# Patient Record
Sex: Male | Born: 1935 | Race: White | Hispanic: No | Marital: Single | Smoking: Former smoker
Health system: Southern US, Community
[De-identification: ages and names within clinical notes are randomized; demographics above are authoritative.]

## PROBLEM LIST (undated history)

## (undated) DIAGNOSIS — J439 Emphysema, unspecified: Secondary | ICD-10-CM

## (undated) DIAGNOSIS — R079 Chest pain, unspecified: Secondary | ICD-10-CM

## (undated) DIAGNOSIS — I10 Essential (primary) hypertension: Secondary | ICD-10-CM

## (undated) DIAGNOSIS — G4730 Sleep apnea, unspecified: Secondary | ICD-10-CM

## (undated) HISTORY — DX: Essential (primary) hypertension: I10

## (undated) HISTORY — DX: Sleep apnea, unspecified: G47.30

## (undated) HISTORY — DX: Chest pain, unspecified: R07.9

## (undated) HISTORY — DX: Emphysema, unspecified: J43.9

---

## 2003-04-17 ENCOUNTER — Encounter: Admission: RE | Admit: 2003-04-17 | Discharge: 2003-04-17 | Payer: Self-pay | Admitting: Orthopaedic Surgery

## 2004-06-02 ENCOUNTER — Ambulatory Visit (HOSPITAL_BASED_OUTPATIENT_CLINIC_OR_DEPARTMENT_OTHER): Admission: RE | Admit: 2004-06-02 | Discharge: 2004-06-02 | Payer: Self-pay | Admitting: Urology

## 2004-06-06 ENCOUNTER — Inpatient Hospital Stay (HOSPITAL_COMMUNITY): Admission: EM | Admit: 2004-06-06 | Discharge: 2004-06-07 | Payer: Self-pay | Admitting: Pulmonary Disease

## 2004-06-06 ENCOUNTER — Ambulatory Visit: Payer: Self-pay | Admitting: Pulmonary Disease

## 2004-06-06 ENCOUNTER — Ambulatory Visit (HOSPITAL_COMMUNITY): Admission: RE | Admit: 2004-06-06 | Discharge: 2004-06-06 | Payer: Self-pay | Admitting: Urology

## 2004-06-14 ENCOUNTER — Ambulatory Visit: Payer: Self-pay | Admitting: Pulmonary Disease

## 2004-06-20 ENCOUNTER — Ambulatory Visit (HOSPITAL_COMMUNITY): Admission: RE | Admit: 2004-06-20 | Discharge: 2004-06-20 | Payer: Self-pay | Admitting: Urology

## 2004-06-28 ENCOUNTER — Ambulatory Visit: Payer: Self-pay | Admitting: Pulmonary Disease

## 2004-09-29 ENCOUNTER — Ambulatory Visit: Payer: Self-pay | Admitting: Pulmonary Disease

## 2005-04-14 ENCOUNTER — Ambulatory Visit: Payer: Self-pay | Admitting: Pulmonary Disease

## 2006-07-30 ENCOUNTER — Ambulatory Visit: Payer: Self-pay | Admitting: Pulmonary Disease

## 2006-09-10 ENCOUNTER — Ambulatory Visit: Payer: Self-pay | Admitting: Pulmonary Disease

## 2007-05-01 DIAGNOSIS — J449 Chronic obstructive pulmonary disease, unspecified: Secondary | ICD-10-CM

## 2007-05-01 DIAGNOSIS — N200 Calculus of kidney: Secondary | ICD-10-CM

## 2007-05-01 DIAGNOSIS — N401 Enlarged prostate with lower urinary tract symptoms: Secondary | ICD-10-CM

## 2007-05-01 DIAGNOSIS — I10 Essential (primary) hypertension: Secondary | ICD-10-CM | POA: Insufficient documentation

## 2011-11-22 ENCOUNTER — Other Ambulatory Visit (HOSPITAL_COMMUNITY): Payer: Self-pay | Admitting: Urology

## 2011-11-22 DIAGNOSIS — D497 Neoplasm of unspecified behavior of endocrine glands and other parts of nervous system: Secondary | ICD-10-CM

## 2011-11-30 ENCOUNTER — Other Ambulatory Visit (HOSPITAL_COMMUNITY): Payer: Self-pay

## 2011-12-04 ENCOUNTER — Other Ambulatory Visit (HOSPITAL_COMMUNITY): Payer: Self-pay | Admitting: Urology

## 2011-12-04 ENCOUNTER — Ambulatory Visit (HOSPITAL_COMMUNITY)
Admission: RE | Admit: 2011-12-04 | Discharge: 2011-12-04 | Disposition: A | Discharge status: Home / Self Care | Payer: Medicare Other | Source: Ambulatory Visit | Attending: Urology | Admitting: Urology

## 2011-12-04 DIAGNOSIS — N289 Disorder of kidney and ureter, unspecified: Secondary | ICD-10-CM | POA: Insufficient documentation

## 2011-12-04 DIAGNOSIS — K5730 Diverticulosis of large intestine without perforation or abscess without bleeding: Secondary | ICD-10-CM | POA: Insufficient documentation

## 2011-12-04 DIAGNOSIS — Z950 Presence of cardiac pacemaker: Secondary | ICD-10-CM | POA: Insufficient documentation

## 2011-12-04 DIAGNOSIS — D497 Neoplasm of unspecified behavior of endocrine glands and other parts of nervous system: Secondary | ICD-10-CM

## 2011-12-04 DIAGNOSIS — E279 Disorder of adrenal gland, unspecified: Secondary | ICD-10-CM | POA: Insufficient documentation

## 2011-12-04 LAB — CREATININE, SERUM
Creatinine, Ser: 1.14 mg/dL (ref 0.50–1.35)
GFR calc Af Amer: 68 mL/min — ABNORMAL LOW (ref >90)
GFR calc non Af Amer: 58 mL/min — ABNORMAL LOW (ref >90)

## 2011-12-04 LAB — BUN: BUN: 19 mg/dL (ref 6–23)

## 2014-03-22 DEATH — deceased

## 2014-06-30 ENCOUNTER — Institutional Professional Consult (permissible substitution): Payer: Medicare Other | Admitting: Critical Care Medicine

## 2014-12-30 ENCOUNTER — Ambulatory Visit (INDEPENDENT_AMBULATORY_CARE_PROVIDER_SITE_OTHER): Payer: Medicare Other | Admitting: Pulmonary Disease

## 2014-12-30 ENCOUNTER — Encounter: Payer: Self-pay | Admitting: Pulmonary Disease

## 2014-12-30 ENCOUNTER — Ambulatory Visit (INDEPENDENT_AMBULATORY_CARE_PROVIDER_SITE_OTHER)
Admission: RE | Admit: 2014-12-30 | Discharge: 2014-12-30 | Disposition: A | Discharge status: Home / Self Care | Payer: Medicare Other | Source: Ambulatory Visit | Attending: Pulmonary Disease | Admitting: Pulmonary Disease

## 2014-12-30 VITALS — BP 150/80 | HR 72 | Temp 97.0°F | Ht 66.0 in | Wt 187.6 lb

## 2014-12-30 DIAGNOSIS — Z8701 Personal history of pneumonia (recurrent): Secondary | ICD-10-CM

## 2014-12-30 DIAGNOSIS — J449 Chronic obstructive pulmonary disease, unspecified: Secondary | ICD-10-CM

## 2014-12-30 DIAGNOSIS — J9610 Chronic respiratory failure, unspecified whether with hypoxia or hypercapnia: Secondary | ICD-10-CM | POA: Insufficient documentation

## 2014-12-30 DIAGNOSIS — J9611 Chronic respiratory failure with hypoxia: Secondary | ICD-10-CM | POA: Diagnosis not present

## 2014-12-30 DIAGNOSIS — J8410 Pulmonary fibrosis, unspecified: Secondary | ICD-10-CM | POA: Diagnosis not present

## 2014-12-30 MED ORDER — PREDNISONE 10 MG PO TABS: ORAL_TABLET | ORAL | Status: AC

## 2014-12-30 NOTE — Patient Instructions (Signed)
Travarus- it was sure nice meeting you today...    I hope that we can help with your breathing...  Today we checked a breathing test, oxygen test & a follow up CXR...    We will contact you w/ the results when available...   For now I want you to continue taking your NEBULIZER w/ Duoneb three times daily (morn, mid-day, evening)  Take the SYMBICORT 2 puffs via AEROCHAMBER twic daily (after the [place] Rx)...  Take the Spiriva once daily after the mid-day Neb treatment)...  We are going to add a trial of an anti-inflammatory medication-- PREDNISONE 10mg  tabs- one tab daily each AM    And we will plan a recheck on this med in 6 weeks...  Call for any questions...  Let's plan a follow up visit here in 6 weeks time.Marland KitchenMarland Kitchen

## 2015-01-05 ENCOUNTER — Encounter: Payer: Self-pay | Admitting: Pulmonary Disease

## 2015-01-05 NOTE — Progress Notes (Signed)
Subjective:     Patient ID: Marco Willis, male   DOB: 1929/05/11, 81 y.o.   MRN: [id number]  HPI 81 y/o WM referred by DrRedding in [facility] for a pulmonary evaluation due to COPD>       [name] has been cared for by DrRedding & DrChodri for many years> hx COPD, hx of pneumonia, r/o OSA (pt refused sleep test), RLS;  He was hospitalized at [facility] 7/20 - 12/11/14 w/ acute on chronic resp failure, COPD exac, and pneumonia> he had cough, sputum, increased dyspnea;  CXR suggested bibasilar atx vs pneumonia, CBC was normal, he was treated w/ Rocephin & Zithromax, plus Oxygen, NEBS and Solumedrol for the COPD; he improved and was disch home;  He had f/u w/ DrRedding & requested a pulmonary second opinion but is not happy about having to come to [facility] for this appt- I assured him that we would transfer his care to our pulm team in [place] once that facility is up & running...       The pt says he's had breathing problems for the last 5-6 yrs> he is an ex-smoker having started at age 10 but only smoked until about 81 y/o (up to 1ppd), and quit 45 yrs ago;  His CC is SOB w/ mild actvities like walking, housework, etc (esp if rushing) but notes that he still does ok if he takes his time; symptoms have been worse in the last 19mo, esp worse in the hot humid weather;  He denies much cough, sput, no blood, denies CP or f/c/s/ etc;  He says that he "keeps pneumonia" siting the recent hodspitalization for "double pneumonia"; he estimates that he's had pneumonia maybe 6 times- starting at age 64 in the service;  He is pretty sure that he's had both pneumonia shots... We do not have records from DrChodri, pt's current meds include> O2 at 2L/min, Symbicort80-2spBid, Spiriva daily, NEBS w/ Duoneb bid at home (he does it 2-3 times daily)...   EXAM showed Afeb, VSS, O2sat=94% on RA;  Heent- neg, mallampati2;  Chest- decr BS at bases w/ few rhonchi, no wheezing/ rales/ or signs of consolidation;  Heart- RR% gr1/6 SEM w/o  r/g;  Abd- soft, neg;  Ext- VI w/o c/c/e...   CT Angio Chest 08/25/11 (in PACS)> neg for PE; fibrosis and atelectasis at bases bilat, no adenopathy, adrenal adenoma, DJD Tspine...   CXR 12/30/14 showed mild cardiomeg & pacer, low lung volumes w/ mild bibasilar atx, NAD...   Spirometry 12/30/14 showed fair cooperation w/ the testing> FVC=1.27 (35%), FEV1=0.87 (32%), %1sec=68, mid-flows were reduced at 20% predicted...  Ambulatory oxygen saturation test 12/30/14> showed O2sat=93% on RA at rest w/ pulse=71/min;  He was only able to walk one lap in the office & stopped w/ dyspnea & fatigue; lowest O2sat=88% w/ pulse=89/min...   IMP/PLAN>>  [name] is 82 and has signif cardiac & pulmonary issues> old data from DrChodri would be helpful in sorting out his historic impairment & any progression; in the meanwhile we discussed consolidating his current bronchodil regimen by using the NEB Tid (approx breakfast/ lunch/ dinner) followed by the Symbicort (via aerochamber) Bid at brkfst & dinner, and the Spiriva at lunch;  In addition we decided on a trial of oral Pred- 10mg  Qam for the next month or so w/ ROV recheck in 4-6weeks; finally I stressed to him the importance of a regular exercise program...    Past Medical History  Diagnosis Date  . Hypertension >> on ToprolXL50 & Demadex20   .  Chest pain    Cardiac pacemaker   . COPD & basilar fibrosis   . 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   Outpatient Encounter Prescriptions as of 12/30/2014  Medication Sig  . ALPRAZolam (XANAX) 0.25 MG tablet Take 0.25 mg by mouth at bedtime.  Marland Kitchen aspirin 81 MG tablet Take 81 mg by mouth daily.  . budesonide-formoterol (SYMBICORT) 80-4.5 MCG/ACT inhaler Inhale 2 puffs into the lungs 2 (two) times daily.  Marland Kitchen escitalopram  (LEXAPRO) 10 MG tablet Take 10 mg by mouth daily.  . finasteride (PROSCAR) 5 MG tablet 5 mg daily.  Marland Kitchen ipratropium-albuterol (DUONEB) 0.5-2.5 (3) MG/3ML SOLN Take 3 mLs by nebulization 2 (two) times daily.  . metoprolol succinate (TOPROL-XL) 50 MG 24 hr tablet 50 mg daily.  Marland Kitchen rOPINIRole (REQUIP) 0.25 MG tablet Take 0.25 mg by mouth at bedtime.  . sennosides-docusate sodium (SENOKOT-S) 8.6-50 MG tablet Take 1 tablet by mouth daily.  Marland Kitchen SPIRIVA HANDIHALER 18 MCG inhalation capsule 1 capsule daily.  Marland Kitchen torsemide (DEMADEX) 20 MG tablet 20 mg daily.  . traMADol (ULTRAM) 50 MG tablet 1 tablet 2 (two) times daily.  . traZODone (DESYREL) 100 MG tablet 100 mg at bedtime.   No facility-administered encounter medications on file as of 12/30/2014.    Allergies  Allergen Reactions  . Penicillins     REACTION: rash    No family history on file.   Social History   Social History  . Marital Status: Single    Spouse Name: N/A  . Number of Children: N/A  . Years of Education: N/A   Occupational History  . Not on file.   Social History Main Topics  . Smoking status: Former Smoker -- 1.00 packs/day for 22 years    Types: Cigarettes    Quit date: 12/29/1973  . Smokeless tobacco: Not on file  . Alcohol Use: Not on file  . Drug Use: Not on file  . Sexual Activity: Not on file   Other Topics Concern  . Not on file   Social History Narrative    Current Medications, Allergies, Past Medical History, Past Surgical History, Family History, and Social History were reviewed in [employer] record.   Review of Systems            All symptoms NEG except where BOLDED >>  Constitutional:  F/C/S, fatigue, anorexia, unexpected weight change. HEENT:  HA, visual changes, hearing loss, earache, nasal symptoms, sore throat, mouth sores, hoarseness. Resp:  cough, sputum, hemoptysis; SOB, tightness, wheezing. Cardio:  CP, palpit, DOE, orthopnea, edema. GI:  N/V/D/C, blood in stool;  reflux, abd pain, distention, gas. GU:  dysuria, freq, urgency, hematuria, flank pain, voiding difficulty. MS:  joint pain, swelling, tenderness, decr ROM; neck pain, back pain, etc. Neuro:  HA, tremors, seizures, dizziness, syncope, weakness, numbness, gait abn. Skin:  suspicious lesions or skin rash. Heme:  adenopathy, bruising, bleeding. Psyche:  confusion, agitation, sleep disturbance, hallucinations, anxiety, depression suicidal.   Objective:   Physical Exam      Vital Signs:  Reviewed...  General:  WD, WN, 81 y/o WM in NAD; alert & oriented; pleasant & cooperative... HEENT:  [place]/AT; Conjunctiva- pink, Sclera- nonicteric, EOM-wnl, PERRLA, EACs-clear, TMs-wnl; NOSE-clear; THROAT-clear &  wnl. Neck:  Stiff w/ fair ROM; no JVD; normal carotid impulses w/o bruits; no thyromegaly or nodules palpated; no lymphadenopathy. Chest:  Diaphragms are high, decr BS at bases, few scat rhonchi, no wheezing or rales or consolidation... Heart:  Regular Rhythm; gr 1/6 SEM w/o rubs or gallops detected. Abdomen:  Soft & nontender- no guarding or rebound; normal bowel sounds; no organomegaly or masses palpated. Ext:  decrROM; without deformities +arthritic changes; no varicose veins, +venous insuffic, tr edema;  Pulses intact w/o bruits. Neuro:  No focal neuro deficits; sensory testing normal; gait is abn & balance OK. Derm:  No lesions noted; no rash etc. Lymph:  No cervical, supraclavicular, axillary, or inguinal adenopathy palpated.   Assessment:      IMP >>    COPD- mixed type> continue NEBS w/ duoneb Tid, Symbicort80-2spBid, Spiriva daily; we added PRED10mg Qam...    Suspect element of pulm fibrosis w/ old CT Chest showing fibrosis & atx at the bases bilat...     Chronic hypoxemic resp failure> continue Home O2    RLS> on Requip    HBP> on MetoprololER & Demadex    LBBB & Cardiac pacemaker> followed by Cards in [place]    Anxiety/ Depression> on Xanax, Desyrel, Lexapro   PLAN >>  River is 74  and has signif cardiac & pulmonary issues> old data from DrChodri would be helpful in sorting out his historic impairment & any progression; in the meanwhile we discussed consolidating his current bronchodil regimen by using the NEB Tid (approx breakfast/ lunch/ dinner) followed by the Symbicort (via aerochamber) Bid at brkfst & dinner, and the Spiriva at lunch;  In addition we decided on a trial of oral Pred- 10mg  Qam for the next month or so w/ ROV recheck in 4-6weeks; finally I stressed to him the importance of a regular exercise program     Plan:     Patient's Medications  New Prescriptions   PREDNISONE (DELTASONE) 10 MG TABLET    Take as directed  Previous Medications   ALPRAZOLAM (XANAX) 0.25 MG TABLET    Take 0.25 mg by mouth at bedtime.   ASPIRIN 81 MG TABLET    Take 81 mg by mouth daily.   BUDESONIDE-FORMOTEROL (SYMBICORT) 80-4.5 MCG/ACT INHALER    Inhale 2 puffs into the lungs 2 (two) times daily.   ESCITALOPRAM (LEXAPRO) 10 MG TABLET    Take 10 mg by mouth daily.   FINASTERIDE (PROSCAR) 5 MG TABLET    5 mg daily.   IPRATROPIUM-ALBUTEROL (DUONEB) 0.5-2.5 (3) MG/3ML SOLN    Take 3 mLs by nebulization 2 (two) times daily.   METOPROLOL SUCCINATE (TOPROL-XL) 50 MG 24 HR TABLET    50 mg daily.   ROPINIROLE (REQUIP) 0.25 MG TABLET    Take 0.25 mg by mouth at bedtime.   SENNOSIDES-DOCUSATE SODIUM (SENOKOT-S) 8.6-50 MG TABLET    Take 1 tablet by mouth daily.   SPIRIVA HANDIHALER 18 MCG INHALATION CAPSULE    1 capsule daily.   TORSEMIDE (DEMADEX) 20 MG TABLET    20 mg daily.   TRAMADOL (ULTRAM) 50 MG TABLET    1 tablet 2 (two) times daily.   TRAZODONE (DESYREL) 100 MG TABLET    100 mg at bedtime.  Modified Medications   No medications on file  Discontinued Medications   No medications on file

## 2015-01-19 ENCOUNTER — Telehealth: Payer: Self-pay | Admitting: Pulmonary Disease

## 2015-01-19 NOTE — Telephone Encounter (Signed)
Called [facility] and reached the answering service. WCB in AM

## 2015-01-20 NOTE — Telephone Encounter (Signed)
Called [facility] and spoke to [place]. Jeani Hawking stated there is no record of this pt nor any issues needing resolution. Jeani Hawking stated she would speak with Maudie Mercury when she returns to the office and will call us back.

## 2015-01-21 NOTE — Telephone Encounter (Signed)
Kim from [facility] return call can be reached @ [phone].Hillery Hunter

## 2015-01-21 NOTE — Telephone Encounter (Signed)
Spoke with Maudie Mercury at [facility], states she does not know what tests needed to be done according to their notes.  After reviewing pt's one visit with SN it looks like 02 was never ordered for pt.  Maudie Mercury will look into this and call us back.

## 2015-01-21 NOTE — Telephone Encounter (Signed)
Called Kim with [facility] and she reports she looked and he is not a patient with them. Per Maudie Mercury, they didn't call our office. Will sign off message

## 2015-02-10 ENCOUNTER — Encounter: Payer: Self-pay | Admitting: Pulmonary Disease

## 2015-02-10 ENCOUNTER — Ambulatory Visit (INDEPENDENT_AMBULATORY_CARE_PROVIDER_SITE_OTHER): Payer: Medicare Other | Admitting: Pulmonary Disease

## 2015-02-10 VITALS — BP 118/60 | HR 80 | Temp 97.4°F | Wt 193.0 lb

## 2015-02-10 DIAGNOSIS — J8410 Pulmonary fibrosis, unspecified: Secondary | ICD-10-CM

## 2015-02-10 DIAGNOSIS — J9611 Chronic respiratory failure with hypoxia: Secondary | ICD-10-CM | POA: Diagnosis not present

## 2015-02-10 DIAGNOSIS — Z8701 Personal history of pneumonia (recurrent): Secondary | ICD-10-CM | POA: Diagnosis not present

## 2015-02-10 DIAGNOSIS — Z23 Encounter for immunization: Secondary | ICD-10-CM | POA: Diagnosis not present

## 2015-02-10 DIAGNOSIS — J449 Chronic obstructive pulmonary disease, unspecified: Secondary | ICD-10-CM

## 2015-02-10 NOTE — Patient Instructions (Signed)
Today we updated your med list in our EPIC system...    Continue your current medications the same...  Continue the breathing treatments w/ the NEBULIZER three times daily, followed by the Symbicort & Spiriva as directed...  We decided to wean off the Prednisone>    Decrease your current dose (10mg  tabs) to one tab every other day until they are gone, then stop the Prednisone for now...  Call for any questions or if I can be of service in any way...  We will arrange for a follow up visit in about 3 months at the [facility] clinic.Marland KitchenMarland Kitchen

## 2015-02-10 NOTE — Progress Notes (Signed)
Subjective:     Patient ID: Marco Willis, male   DOB: 12-17-1928, 81 y.o.   MRN: [id number]  HPI ~  December 30, 2014:  Initial pulmonary consult w/ SN>   86 y/o WM referred by DrRedding in [facility] for a pulmonary evaluation due to COPD>       [name] has been cared for by DrRedding & DrChodri for many years> hx COPD, hx of pneumonia, r/o OSA (pt refused sleep test), RLS;  He was hospitalized at [facility] 7/20 - 12/11/14 w/ acute on chronic resp failure, COPD exac, and pneumonia> he had cough, sputum, increased dyspnea;  CXR suggested bibasilar atx vs pneumonia, CBC was normal, he was treated w/ Rocephin & Zithromax, plus Oxygen, NEBS and Solumedrol for the COPD; he improved and was disch home;  He had f/u w/ DrRedding & requested a pulmonary second opinion but is not happy about having to come to [facility] for this appt- I assured him that we would transfer his care to our pulm team in [place] once that facility is up & running...       The pt says he's had breathing problems for the last 5-6 yrs> he is an ex-smoker having started at age 38 but only smoked until about 81 y/o (up to 1ppd), and quit 45 yrs ago;  His CC is SOB w/ mild actvities like walking, housework, etc (esp if rushing) but notes that he still does ok if he takes his time; symptoms have been worse in the last 24mo, esp worse in the hot humid weather;  He denies much cough, sput, no blood, denies CP or f/c/s/ etc;  He says that he "keeps pneumonia" siting the recent hospitalization for "double pneumonia"; he estimates that he's had pneumonia maybe 6 times- starting at age 44 in the service;  He is pretty sure that he's had both pneumonia shots... We do not have records from DrChodri, pt's current meds include> O2 at 2L/min, Symbicort80-2spBid, Spiriva daily, NEBS w/ Duoneb bid at home (he does it 2-3 times daily)...       EXAM showed Afeb, VSS, O2sat=94% on RA;  Heent- neg, mallampati2;  Chest- decr BS at bases w/ few rhonchi, no wheezing/  rales/ or signs of consolidation;  Heart- RR% gr1/6 SEM w/o r/g;  Abd- soft, neg;  Ext- VI w/o c/c/e...   CT Angio Chest 08/25/11 (in PACS)> neg for PE; fibrosis and atelectasis at bases bilat, no adenopathy, adrenal adenoma, DJD Tspine...   CXR 12/30/14 showed mild cardiomeg & pacer, low lung volumes w/ mild bibasilar atx, NAD...   Spirometry 12/30/14 showed fair cooperation w/ the testing> FVC=1.27 (35%), FEV1=0.87 (32%), %1sec=68, mid-flows were reduced at 20% predicted...c/w combined mild obstructive & mod restrictive dis  Ambulatory oxygen saturation test 12/30/14> showed O2sat=93% on RA at rest w/ pulse=71/min;  He was only able to walk one lap in the office & stopped w/ dyspnea & fatigue; lowest O2sat=88% w/ pulse=89/min...   IMP/PLAN>>  Marco Willis is 44 and has signif cardiac & pulmonary issues> old data from DrChodri would be helpful in sorting out his historic impairment & any progression; in the meanwhile we discussed consolidating his current bronchodil regimen by using the NEB Tid (approx breakfast/ lunch/ dinner) followed by the Symbicort (via aerochamber) Bid at brkfst & dinner, and the Spiriva at lunch;  In addition we decided on a trial of oral Pred- 10mg  Qam for the next month or so w/ ROV recheck in 4-6weeks; finally I stressed to him  the importance of a regular exercise program...   ~  February 10, 2015:  6wk ROV w/ SN>  His PCP is DrBurkhart in [place]- they will check w/ him to be sure he's had both pneumonia vaccines...      [name] has been using the NEBULIZER w/ Duoneb tid followed by his Symbicort80-2spBid & Spiriva once daily, and the Pred10mg /d; he has home O2 at 2L/min; he notes breathing is "about the same" but then notes that he only notes the SOB when carrying 50 lb bags of feed!  Overall "doing pretty good", getting PT & doing some exercise (gardening, walking); he notes min cough, sput-clear, SOB w/o change and no CP, swelling, etc; his CC is that his balance is poor & I noted  to him that the PT should address that issue; we never received records from DrChodri...  Pt's son indicates to me that he is doing much better by his estimate- getting about better, resting better, & family keeping up w/ his meds...       EXAM showed Afeb, VSS, O2sat=93% on 2L/min;  Heent- neg, mallampati2;  Chest- decr BS at bases, clear- no wheezing/ rales/ or signs of consolidation;  Heart- RR, gr1/6 SEM w/o r/g;  Abd- soft, neg;  Ext- VI w/o c/c/e... IMP/PLAN>>  Marco Willis is improved & we decided to slowly wean down the Pred Rx- currently on 10mg /d & rec to decr to 10mg  Qod til gone, then stop the Pred; he is to continue the NEBS Tid, Symbicort Bid, Spiriva daily, and continue his exercise program;  We gave him the 2016 FLU vaccine today... rec ROV in 78mo & we will try to get him an appt in the [place] clinic per his request...    Past Medical History  Diagnosis Date  . Hypertension >> on ToprolXL50 & Demadex20   . Chest pain >> on ASA81    Cardiac pacemaker   . COPD & basilar fibrosis   . 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   Outpatient Encounter Prescriptions as of 02/10/2015  Medication Sig  . ALPRAZolam (XANAX) 0.25 MG tablet Take 0.25 mg by mouth at bedtime.  Marland Kitchen aspirin 81 MG tablet Take 81 mg by mouth daily.  . budesonide-formoterol (SYMBICORT) 80-4.5 MCG/ACT inhaler Inhale 2 puffs into the lungs 2 (two) times daily.  Marland Kitchen escitalopram (LEXAPRO) 10 MG tablet Take 10 mg by mouth daily.  . finasteride (PROSCAR) 5 MG tablet 5 mg daily.  Marland Kitchen ipratropium-albuterol (DUONEB) 0.5-2.5 (3) MG/3ML SOLN Take 3 mLs by nebulization 2 (two) times daily.  . metoprolol succinate (TOPROL-XL) 50 MG 24 hr tablet 50 mg daily.  . predniSONE (DELTASONE) 10 MG tablet Take as directed  . rOPINIRole  (REQUIP) 0.25 MG tablet Take 0.25 mg by mouth at bedtime.  . sennosides-docusate sodium (SENOKOT-S) 8.6-50 MG tablet Take 1 tablet by mouth daily.  Marland Kitchen SPIRIVA HANDIHALER 18 MCG inhalation capsule 1 capsule daily.  Marland Kitchen torsemide (DEMADEX) 20 MG tablet 20 mg daily.  . traMADol (ULTRAM) 50 MG tablet 1 tablet 2 (two) times daily.  . traZODone (DESYREL) 100 MG tablet 100 mg at bedtime.   No facility-administered encounter medications on file as of 02/10/2015.    Allergies  Allergen Reactions  .  Penicillins     REACTION: rash    Current Medications, Allergies, Past Medical History, Past Surgical History, Family History, and Social History were reviewed in [employer] record.   Review of Systems            All symptoms NEG except where BOLDED >>  Constitutional:  F/C/S, fatigue, anorexia, unexpected weight change. HEENT:  HA, visual changes, hearing loss, earache, nasal symptoms, sore throat, mouth sores, hoarseness. Resp:  cough, sputum, hemoptysis; SOB, tightness, wheezing. Cardio:  CP, palpit, DOE, orthopnea, edema. GI:  N/V/D/C, blood in stool; reflux, abd pain, distention, gas. GU:  dysuria, freq, urgency, hematuria, flank pain, voiding difficulty. MS:  joint pain, swelling, tenderness, decr ROM; neck pain, back pain, etc. Neuro:  HA, tremors, seizures, dizziness, syncope, weakness, numbness, gait abn. Skin:  suspicious lesions or skin rash. Heme:  adenopathy, bruising, bleeding. Psyche:  confusion, agitation, sleep disturbance, hallucinations, anxiety, depression suicidal.   Objective:   Physical Exam      Vital Signs:  Reviewed...  General:  WD, WN, 81 y/o WM in NAD; alert & oriented; pleasant & cooperative... HEENT:  [place]/AT; Conjunctiva- pink, Sclera- nonicteric, EOM-wnl, PERRLA, EACs-clear, TMs-wnl; NOSE-clear; THROAT-clear & wnl. Neck:  Stiff w/ fair ROM; no JVD; normal carotid impulses w/o bruits; no thyromegaly or nodules palpated; no  lymphadenopathy. Chest:  Diaphragms are high, decr BS at bases, few scat rhonchi, no wheezing or rales or consolidation... Heart:  Regular Rhythm; gr 1/6 SEM w/o rubs or gallops detected. Abdomen:  Soft & nontender- no guarding or rebound; normal bowel sounds; no organomegaly or masses palpated. Ext:  decrROM; without deformities +arthritic changes; no varicose veins, +venous insuffic, tr edema;  Pulses intact w/o bruits. Neuro:  No focal neuro deficits; sensory testing normal; gait is abn & balance OK. Derm:  No lesions noted; no rash etc. Lymph:  No cervical, supraclavicular, axillary, or inguinal adenopathy palpated.   Assessment:      IMP >>    COPD- mixed type> continue NEBS w/ duoneb Tid, Symbicort80-2spBid, Spiriva daily; we will wean down the Pred...    Suspect element of pulm fibrosis w/ old CT Chest showing fibrosis & atx at the bases bilat & PFT w/ combined defect...    Chronic hypoxemic resp failure> continue Home O2    RLS> on Requip    HBP> on MetoprololER & Demadex    LBBB & Cardiac pacemaker> followed by Cards in [place]    Anxiety/ Depression> on Xanax, Desyrel, Lexapro   PLAN >>  8/10>  Alexandr is 89 and has signif cardiac & pulmonary issues> old data from DrChodri would be helpful in sorting out his historic impairment & any progression; in the meanwhile we discussed consolidating his current bronchodil regimen by using the NEB Tid (approx breakfast/ lunch/ dinner) followed by the Symbicort (via aerochamber) Bid at brkfst & dinner, and the Spiriva at lunch;  In addition we decided on a trial of oral Pred- 10mg  Qam for the next month or so w/ ROV recheck in 4-6weeks; finally I stressed to him the importance of a regular exercise program. 9/21>  Miki is improved & we decided to slowly wean down the Pred Rx- currently on 10mg /d & rec to decr to 10mg  Qod til gone, then stop the Pred; he is to continue the NEBS Tid, Symbicort Bid, Spiriva daily, and continue his exercise  program;  We gave him the 2016 FLU vaccine today... rec ROV in 2mo & we will try to get him  an appt in the [place] clinic per his request.     Plan:     Patient's Medications  New Prescriptions   No medications on file  Previous Medications   ALPRAZOLAM (XANAX) 0.25 MG TABLET    Take 0.25 mg by mouth at bedtime.   ASPIRIN 81 MG TABLET    Take 81 mg by mouth daily.   BUDESONIDE-FORMOTEROL (SYMBICORT) 80-4.5 MCG/ACT INHALER    Inhale 2 puffs into the lungs 2 (two) times daily.   ESCITALOPRAM (LEXAPRO) 10 MG TABLET    Take 10 mg by mouth daily.   FINASTERIDE (PROSCAR) 5 MG TABLET    5 mg daily.   IPRATROPIUM-ALBUTEROL (DUONEB) 0.5-2.5 (3) MG/3ML SOLN    Take 3 mLs by nebulization 2 (two) times daily.   METOPROLOL SUCCINATE (TOPROL-XL) 50 MG 24 HR TABLET    50 mg daily.   PREDNISONE (DELTASONE) 10 MG TABLET    Take as directed   ROPINIROLE (REQUIP) 0.25 MG TABLET    Take 0.25 mg by mouth at bedtime.   SENNOSIDES-DOCUSATE SODIUM (SENOKOT-S) 8.6-50 MG TABLET    Take 1 tablet by mouth daily.   SPIRIVA HANDIHALER 18 MCG INHALATION CAPSULE    1 capsule daily.   TORSEMIDE (DEMADEX) 20 MG TABLET    20 mg daily.   TRAMADOL (ULTRAM) 50 MG TABLET    1 tablet 2 (two) times daily.   TRAZODONE (DESYREL) 100 MG TABLET    100 mg at bedtime.  Modified Medications   No medications on file  Discontinued Medications   No medications on file

## 2015-04-12 ENCOUNTER — Ambulatory Visit (INDEPENDENT_AMBULATORY_CARE_PROVIDER_SITE_OTHER)
Admission: RE | Admit: 2015-04-12 | Discharge: 2015-04-12 | Disposition: A | Discharge status: Home / Self Care | Payer: Medicare Other | Source: Ambulatory Visit | Attending: Pulmonary Disease | Admitting: Pulmonary Disease

## 2015-04-12 ENCOUNTER — Other Ambulatory Visit (INDEPENDENT_AMBULATORY_CARE_PROVIDER_SITE_OTHER): Payer: Medicare Other

## 2015-04-12 ENCOUNTER — Encounter: Payer: Self-pay | Admitting: Pulmonary Disease

## 2015-04-12 ENCOUNTER — Ambulatory Visit (INDEPENDENT_AMBULATORY_CARE_PROVIDER_SITE_OTHER): Payer: Medicare Other | Admitting: Pulmonary Disease

## 2015-04-12 VITALS — BP 142/80 | HR 75 | Temp 98.0°F | Resp 16 | Wt 183.6 lb

## 2015-04-12 DIAGNOSIS — J8410 Pulmonary fibrosis, unspecified: Secondary | ICD-10-CM

## 2015-04-12 DIAGNOSIS — J449 Chronic obstructive pulmonary disease, unspecified: Secondary | ICD-10-CM

## 2015-04-12 DIAGNOSIS — R0600 Dyspnea, unspecified: Secondary | ICD-10-CM

## 2015-04-12 DIAGNOSIS — J9611 Chronic respiratory failure with hypoxia: Secondary | ICD-10-CM

## 2015-04-12 LAB — BASIC METABOLIC PANEL
BUN: 20 mg/dL (ref 6–23)
CALCIUM: 10.3 mg/dL (ref 8.4–10.5)
CO2: 32 meq/L (ref 19–32)
Chloride: 103 mEq/L (ref 96–112)
Creatinine, Ser: 1.29 mg/dL (ref 0.40–1.50)
GFR: 56.31 mL/min — ABNORMAL LOW (ref >60.00)
GLUCOSE: 89 mg/dL (ref 70–99)
Potassium: 3.6 mEq/L (ref 3.5–5.1)
SODIUM: 140 meq/L (ref 135–145)

## 2015-04-12 LAB — BRAIN NATRIURETIC PEPTIDE: PRO B NATRI PEPTIDE: 50 pg/mL (ref 0.0–100.0)

## 2015-04-12 LAB — SEDIMENTATION RATE: Sed Rate: 9 mm/hr (ref 0–22)

## 2015-04-12 MED ORDER — METHYLPREDNISOLONE ACETATE 80 MG/ML IJ SUSP
80.0000 mg | Freq: Once | INTRAMUSCULAR | Status: AC
  Administered 2015-04-12: 80 mg via INTRAMUSCULAR

## 2015-04-12 MED ORDER — PREDNISONE 20 MG PO TABS: 20.0000 mg | ORAL_TABLET | Freq: Every day | ORAL | Status: AC

## 2015-04-12 NOTE — Patient Instructions (Signed)
Today we updated your med list in our EPIC system...    Continue your current medications the same...  Today we rechecked your CXR & blood work...    We will contact you w/ the results when available...   We gave you a Depo shot & restarted your PREDNISONE 20mg  tabs...    Take one tab each AM til return office visit...  Call for any questions...  Let's plan a follow up visit in 4 weeks, sooner if needed for problems.Marland KitchenMarland Kitchen

## 2015-04-12 NOTE — Progress Notes (Signed)
Subjective:     Patient ID: Marco Willis, male   DOB: 1928/10/18, 81 y.o.   MRN: [id number]  HPI ~  December 30, 2014:  Initial pulmonary consult w/ SN>   46 y/o WM referred by DrRedding in [facility] for a pulmonary evaluation due to COPD>       [name] has been cared for by DrRedding & DrChodri for many years> hx COPD, hx of pneumonia, r/o OSA (pt refused sleep test), RLS;  He was hospitalized at [facility] 7/20 - 12/11/14 w/ acute on chronic resp failure, COPD exac, and pneumonia> he had cough, sputum, increased dyspnea;  CXR suggested bibasilar atx vs pneumonia, CBC was normal, he was treated w/ Rocephin & Zithromax, plus Oxygen, NEBS and Solumedrol for the COPD; he improved and was disch home;  He had f/u w/ DrRedding & requested a pulmonary second opinion but is not happy about having to come to [facility] for this appt- I assured him that we would transfer his care to our pulm team in [place] once that facility is up & running...       The pt says he's had breathing problems for the last 5-6 yrs> he is an ex-smoker having started at age 71 but only smoked until about 81 y/o (up to 1ppd), and quit 25 yrs ago;  His CC is SOB w/ mild actvities like walking, housework, etc (esp if rushing) but notes that he still does ok if he takes his time; symptoms have been worse in the last 91mo, esp worse in the hot humid weather;  He denies much cough, sput, no blood, denies CP or f/c/s/ etc;  He says that he "keeps pneumonia" siting the recent hospitalization for "double pneumonia"; he estimates that he's had pneumonia maybe 6 times- starting at age 85 in the service;  He is pretty sure that he's had both pneumonia shots... We do not have records from DrChodri, pt's current meds include> O2 at 2L/min, Symbicort80-2spBid, Spiriva daily, NEBS w/ Duoneb bid at home (he does it 2-3 times daily)...       EXAM showed Afeb, VSS, O2sat=94% on RA;  Heent- neg, mallampati2;  Chest- decr BS at bases w/ few rhonchi, no wheezing/  rales/ or signs of consolidation;  Heart- RR% gr1/6 SEM w/o r/g;  Abd- soft, neg;  Ext- VI w/o c/c/e...   CT Angio Chest 08/25/11 (in PACS)> neg for PE; fibrosis and atelectasis at bases bilat, no adenopathy, adrenal adenoma, DJD Tspine...   CXR 12/30/14 showed mild cardiomeg & pacer, low lung volumes w/ mild bibasilar atx, NAD...   Spirometry 12/30/14 showed fair cooperation w/ the testing> FVC=1.27 (35%), FEV1=0.87 (32%), %1sec=68, mid-flows were reduced at 20% predicted...c/w combined mild obstructive & mod restrictive dis  Ambulatory oxygen saturation test 12/30/14> showed O2sat=93% on RA at rest w/ pulse=71/min;  He was only able to walk one lap in the office & stopped w/ dyspnea & fatigue; lowest O2sat=88% w/ pulse=89/min...        IMP/PLAN>>  Marco Willis is 21 and has signif cardiac & pulmonary issues> old data from DrChodri would be helpful in sorting out his historic impairment & any progression; in the meanwhile we discussed consolidating his current bronchodil regimen by using the NEB Tid (approx breakfast/ lunch/ dinner) followed by the Symbicort (via aerochamber) Bid at brkfst & dinner, and the Spiriva at lunch;  In addition we decided on a trial of oral Pred- 10mg  Qam for the next month or so w/ ROV recheck in 4-6weeks;  finally I stressed to him the importance of a regular exercise program...   ~  February 10, 2015:  6wk ROV w/ SN>  His PCP is DrBurkhart in [place]- they will check w/ him to be sure he's had both pneumonia vaccines...      Delphine has been using the NEBULIZER w/ Duoneb tid followed by his Symbicort80-2spBid & Spiriva once daily, and the Pred10mg /d; he has home O2 at 2L/min; he notes breathing is "about the same" but then notes that he only notes the SOB when carrying 50 lb bags of feed!  Overall "doing pretty good", getting PT & doing some exercise (gardening, walking); he notes min cough, sput-clear, SOB w/o change and no CP, swelling, etc; his CC is that his balance is poor & I  noted to him that the PT should address that issue; we never received records from DrChodri...  Pt's son indicates to me that he is doing much better by his estimate- getting about better, resting better, & family keeping up w/ his meds...       EXAM showed Afeb, VSS, O2sat=93% on 2L/min;  Heent- neg, mallampati2;  Chest- decr BS at bases, clear- no wheezing/ rales/ or signs of consolidation;  Heart- RR, gr1/6 SEM w/o r/g;  Abd- soft, neg;  Ext- VI w/o c/c/e...      IMP/PLAN>>  Marco Willis is improved & we decided to slowly wean down the Pred Rx- currently on 10mg /d & rec to decr to 10mg  Qod til gone, then stop the Pred; he is to continue the NEBS Tid, Symbicort Bid, Spiriva daily, and continue his exercise program;  We gave him the 2016 FLU vaccine today... rec ROV in 23mo & we will try to get him an appt in the [place] clinic per his request...   ~  April 12, 2015:  46mo ROV & add-on appt for increased SOB>  When last seen he was stable to improving on his DUONEB-Tid, Symbirot160-2spBid & Spiriva daily; he was still on Pred10mg /d & we weaned this down & off in the interim;  He presents for an add-on visit due to increased SOB, now w/ ADLs, min cough, small amt yellow sput, no hemoptysis, no f/c/s, no CP... He had his pacer battery changed 2wks ago when all this started... we reviewed the following medical problems during today's office visit >>       COPD, mixed type, ex-smoker> on DUONEB Bid, Symbicort160-2spBid, Spiriva daily, O2 at 2-3L/min      Postinflamm pulm fibrosis> old CTChest showed fibrosis & atx at bases, PFT w/ combined obstructive & restrictive defects      Chr hypoxemic resp failure> on Home O2 at 2-3L/min...      Hx RLS> on requip      Cardiac- HBP, LBBB, Pacemaker       Anxiety/ depression> he has alprazolam 0.25mg  prn + Lexapro 7 Desyrel from his PCP...  EXAM showed Afeb, VSS, O2sat=95% on 3L/min;  Heent- neg, mallampati2;  Chest- decr BS at bases, clear- no wheezing/ rales/ or signs of  consolidation;  Heart- RR, gr1/6 SEM w/o r/g;  Abd- soft, neg;  Ext- VI w/o c/c/e...  CXR 04/12/15 showed mild cardiomeg & pacer- unchanged; mild basilar atx & ?tiny effusion/ no evid CHF; DJD in Tspine...  LABS 04/12/15>  Chems- wnl w/ Cr=1.29;  BNP=50;  CBC- wnl;  Sed=9... IMP/PLAN>>  We decided to bump the Pred back to 20mg  Qam & plan recheck in 4wks; continue O2, NEB w/ Duoneb, Symbicort & Spiriva;  call for any prob in the meanwhile...     Past Medical History  Diagnosis Date  . Hypertension >> on ToprolXL50 & Demadex20   . Chest pain >> on ASA81    Cardiac pacemaker   . COPD & basilar fibrosis   . 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   Outpatient Encounter Prescriptions as of 04/12/2015  Medication Sig  . ALPRAZolam (XANAX) 0.25 MG tablet Take 0.25 mg by mouth at bedtime.  Marland Kitchen aspirin 81 MG tablet Take 81 mg by mouth daily.  . budesonide-formoterol (SYMBICORT) 80-4.5 MCG/ACT inhaler Inhale 2 puffs into the lungs 2 (two) times daily.  Marland Kitchen escitalopram (LEXAPRO) 10 MG tablet Take 10 mg by mouth daily.  . finasteride (PROSCAR) 5 MG tablet 5 mg daily.  Marland Kitchen ipratropium-albuterol (DUONEB) 0.5-2.5 (3) MG/3ML SOLN Take 3 mLs by nebulization 2 (two) times daily.  . metoprolol succinate (TOPROL-XL) 50 MG 24 hr tablet 50 mg daily.  Marland Kitchen rOPINIRole (REQUIP) 0.25 MG tablet Take 0.25 mg by mouth at bedtime.  . sennosides-docusate sodium (SENOKOT-S) 8.6-50 MG tablet Take 1 tablet by mouth daily.  Marland Kitchen SPIRIVA HANDIHALER 18 MCG inhalation capsule 1 capsule daily.  Marland Kitchen torsemide (DEMADEX) 20 MG tablet 20 mg daily.  . traMADol (ULTRAM) 50 MG tablet 1 tablet 2 (two) times daily.  . traZODone (DESYREL) 100 MG tablet 100 mg at bedtime.    Allergies  Allergen Reactions  . Penicillins     REACTION:  rash    Current Medications, Allergies, Past Medical History, Past Surgical History, Family History, and Social History were reviewed in [employer] record.   Review of Systems            All symptoms NEG except where BOLDED >>  Constitutional:  F/C/S, fatigue, anorexia, unexpected weight change. HEENT:  HA, visual changes, hearing loss, earache, nasal symptoms, sore throat, mouth sores, hoarseness. Resp:  cough, sputum, hemoptysis; SOB, tightness, wheezing. Cardio:  CP, palpit, DOE, orthopnea, edema. GI:  N/V/D/C, blood in stool; reflux, abd pain, distention, gas. GU:  dysuria, freq, urgency, hematuria, flank pain, voiding difficulty. MS:  joint pain, swelling, tenderness, decr ROM; neck pain, back pain, etc. Neuro:  HA, tremors, seizures, dizziness, syncope, weakness, numbness, gait abn. Skin:  suspicious lesions or skin rash. Heme:  adenopathy, bruising, bleeding. Psyche:  confusion, agitation, sleep disturbance, hallucinations, anxiety, depression suicidal.   Objective:   Physical Exam      Vital Signs:  Reviewed...   General:  WD, WN, 81 y/o WM in NAD; alert & oriented; pleasant & cooperative... HEENT:  [place]/AT; Conjunctiva- pink, Sclera- nonicteric, EOM-wnl, PERRLA, EACs-clear, TMs-wnl; NOSE-clear; THROAT-clear & wnl.  Neck:  Stiff w/ fair ROM; no JVD; normal carotid impulses w/o bruits; no thyromegaly or nodules palpated; no lymphadenopathy.  Chest:  Diaphragms are high, decr BS at bases, few scat rhonchi, no wheezing or rales or consolidation... Heart:  Regular Rhythm; gr 1/6 SEM w/o rubs or gallops detected. Abdomen:  Soft & nontender- no guarding or rebound; normal bowel sounds; no organomegaly or masses palpated. Ext:  decrROM; without deformities +arthritic changes; no varicose veins, +venous insuffic, tr edema;  Pulses intact w/o bruits. Neuro:  No focal neuro deficits; sensory testing normal; gait  is abn & balance OK. Derm:  No lesions noted; no  rash etc. Lymph:  No cervical, supraclavicular, axillary, or inguinal adenopathy palpated.   Assessment:      IMP >>    COPD- mixed type> continue NEBS w/ duoneb Tid, Symbicort80-2spBid, Spiriva daily; we will wean down the Pred...    Suspect element of pulm fibrosis w/ old CT Chest showing fibrosis & atx at the bases bilat & PFT w/ combined defect...    Chronic hypoxemic resp failure> continue Home O2    RLS> on Requip    HBP> on MetoprololER & Demadex    LBBB & Cardiac pacemaker> followed by [facility] in [place]    Anxiety/ Depression> on Xanax, Desyrel, Lexapro   PLAN >>  8/10>  Jhovanny is 87 and has signif cardiac & pulmonary issues> old data from DrChodri would be helpful in sorting out his historic impairment & any progression; in the meanwhile we discussed consolidating his current bronchodil regimen by using the NEB Tid (approx breakfast/ lunch/ dinner) followed by the Symbicort (via aerochamber) Bid at brkfst & dinner, and the Spiriva at lunch;  In addition we decided on a trial of oral Pred- 10mg  Qam for the next month or so w/ ROV recheck in 4-6weeks; finally I stressed to him the importance of a regular exercise program. 9/21>  Aadyn is improved & we decided to slowly wean down the Pred Rx- currently on 10mg /d & rec to decr to 10mg  Qod til gone, then stop the Pred; he is to continue the NEBS Tid, Symbicort Bid, Spiriva daily, and continue his exercise program;  We gave him the 2016 FLU vaccine today... rec ROV in 25mo & we will try to get him an appt in the [place] clinic per his request. 04/12/15>  Add-on appt for worsening dyspnea- we incr the Pred to 20mg  Qam 7 plan ROV recheck in 15mo...     Plan:     Patient's Medications  New Prescriptions   PREDNISONE (DELTASONE) 20 MG TABLET    Take 1 tablet (20 mg total) by mouth daily with breakfast.  Previous Medications   ALPRAZOLAM (XANAX) 0.25 MG TABLET    Take 0.25 mg by mouth at bedtime.   ASPIRIN 81 MG TABLET    Take 81 mg by  mouth daily.   BUDESONIDE-FORMOTEROL (SYMBICORT) 80-4.5 MCG/ACT INHALER    Inhale 2 puffs into the lungs 2 (two) times daily.   ESCITALOPRAM (LEXAPRO) 10 MG TABLET    Take 10 mg by mouth daily.   FINASTERIDE (PROSCAR) 5 MG TABLET    5 mg daily.   IPRATROPIUM-ALBUTEROL (DUONEB) 0.5-2.5 (3) MG/3ML SOLN    Take 3 mLs by nebulization 2 (two) times daily.   METOPROLOL SUCCINATE (TOPROL-XL) 50 MG 24 HR TABLET    50 mg daily.   ROPINIROLE (REQUIP) 0.25 MG TABLET    Take 0.25 mg by mouth at bedtime.   SENNOSIDES-DOCUSATE SODIUM (SENOKOT-S) 8.6-50 MG TABLET    Take 1 tablet by mouth daily.   SPIRIVA HANDIHALER 18 MCG INHALATION CAPSULE    1 capsule daily.   TORSEMIDE (DEMADEX) 20 MG TABLET    20 mg daily.   TRAMADOL (ULTRAM) 50 MG TABLET    1 tablet 2 (two) times daily.   TRAZODONE (DESYREL) 100 MG TABLET    100 mg at bedtime.  Modified Medications   No medications on file  Discontinued Medications   No medications on file

## 2015-04-13 LAB — CBC WITH DIFFERENTIAL/PLATELET
Basophils Absolute: 0 10*3/uL (ref 0.0–0.1)
Basophils Relative: 0.3 % (ref 0.0–3.0)
EOS PCT: 2 % (ref 0.0–5.0)
Eosinophils Absolute: 0.2 10*3/uL (ref 0.0–0.7)
HEMATOCRIT: 41.2 % (ref 39.0–52.0)
HEMOGLOBIN: 13.8 g/dL (ref 13.0–17.0)
LYMPHS PCT: 16.1 % (ref 12.0–46.0)
Lymphs Abs: 1.2 10*3/uL (ref 0.7–4.0)
MCHC: 33.5 g/dL (ref 30.0–36.0)
MCV: 94.3 fl (ref 78.0–100.0)
MONO ABS: 0.3 10*3/uL (ref 0.1–1.0)
MONOS PCT: 4.5 % (ref 3.0–12.0)
Neutro Abs: 5.9 10*3/uL (ref 1.4–7.7)
Neutrophils Relative %: 77.1 % — ABNORMAL HIGH (ref 43.0–77.0)
Platelets: 199 10*3/uL (ref 150.0–400.0)
RBC: 4.37 Mil/uL (ref 4.22–5.81)
RDW: 14.5 % (ref 11.5–15.5)
WBC: 7.7 10*3/uL (ref 4.0–10.5)

## 2015-04-14 ENCOUNTER — Telehealth: Payer: Self-pay | Admitting: Pulmonary Disease

## 2015-04-14 NOTE — Telephone Encounter (Signed)
Called spoke w/ pt. He is requesting his lab and CXR results from yesterday. He requests a call back today. Please advise SN thanks  --results printed with phone note

## 2015-04-14 NOTE — Telephone Encounter (Signed)
Per SN>> Please call pt and inform that labwork and cxr are WNL.  Called and spoke with pt and informed of results per SN Pt voiced understanding  Nothing further is needed at this time.

## 2015-05-11 ENCOUNTER — Ambulatory Visit: Payer: Medicare Other | Admitting: Pulmonary Disease

## 2015-12-21 DEATH — deceased

## 2017-06-06 IMAGING — CR DG CHEST 2V
2 series · 2 of 2 positions shown · non-contrast
Comparison: 12/25/2014.

CLINICAL DATA: Hypertension.  COPD.

EXAM:
CHEST  2 VIEW

[view not recorded (1 of 2)]
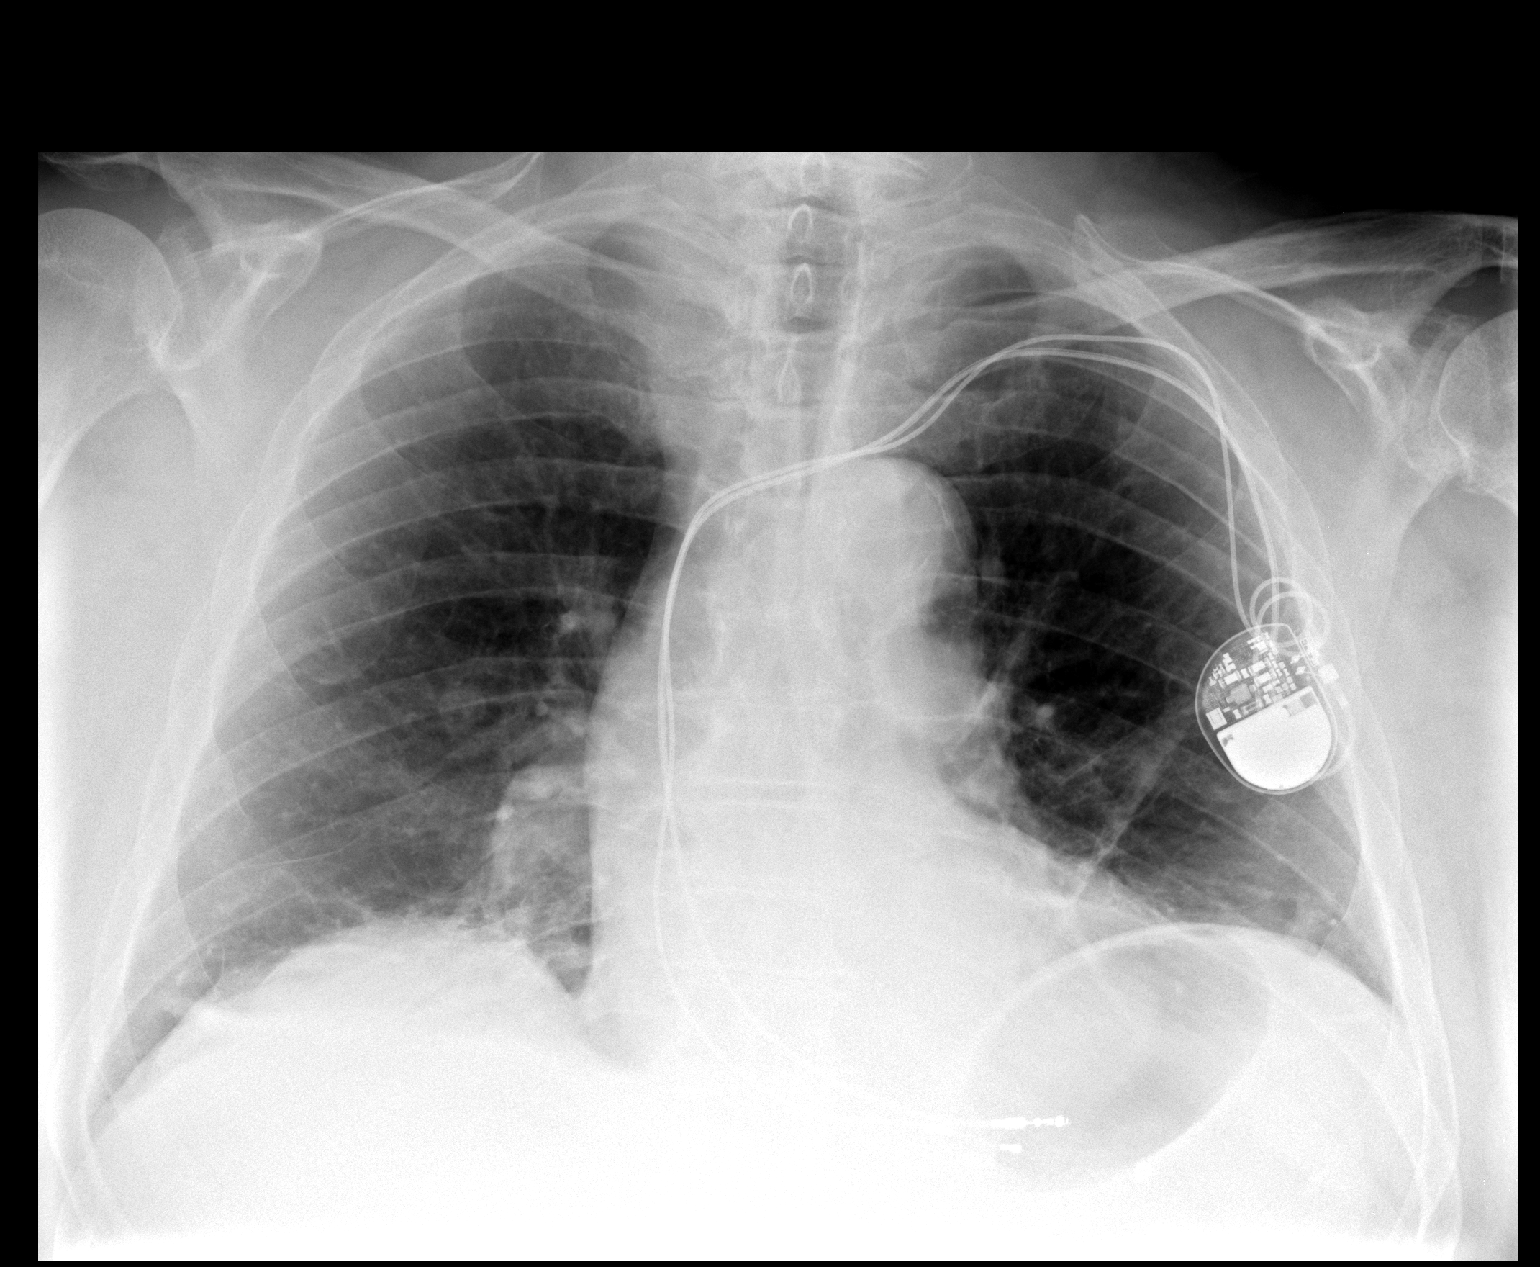

[view not recorded (2 of 2)]
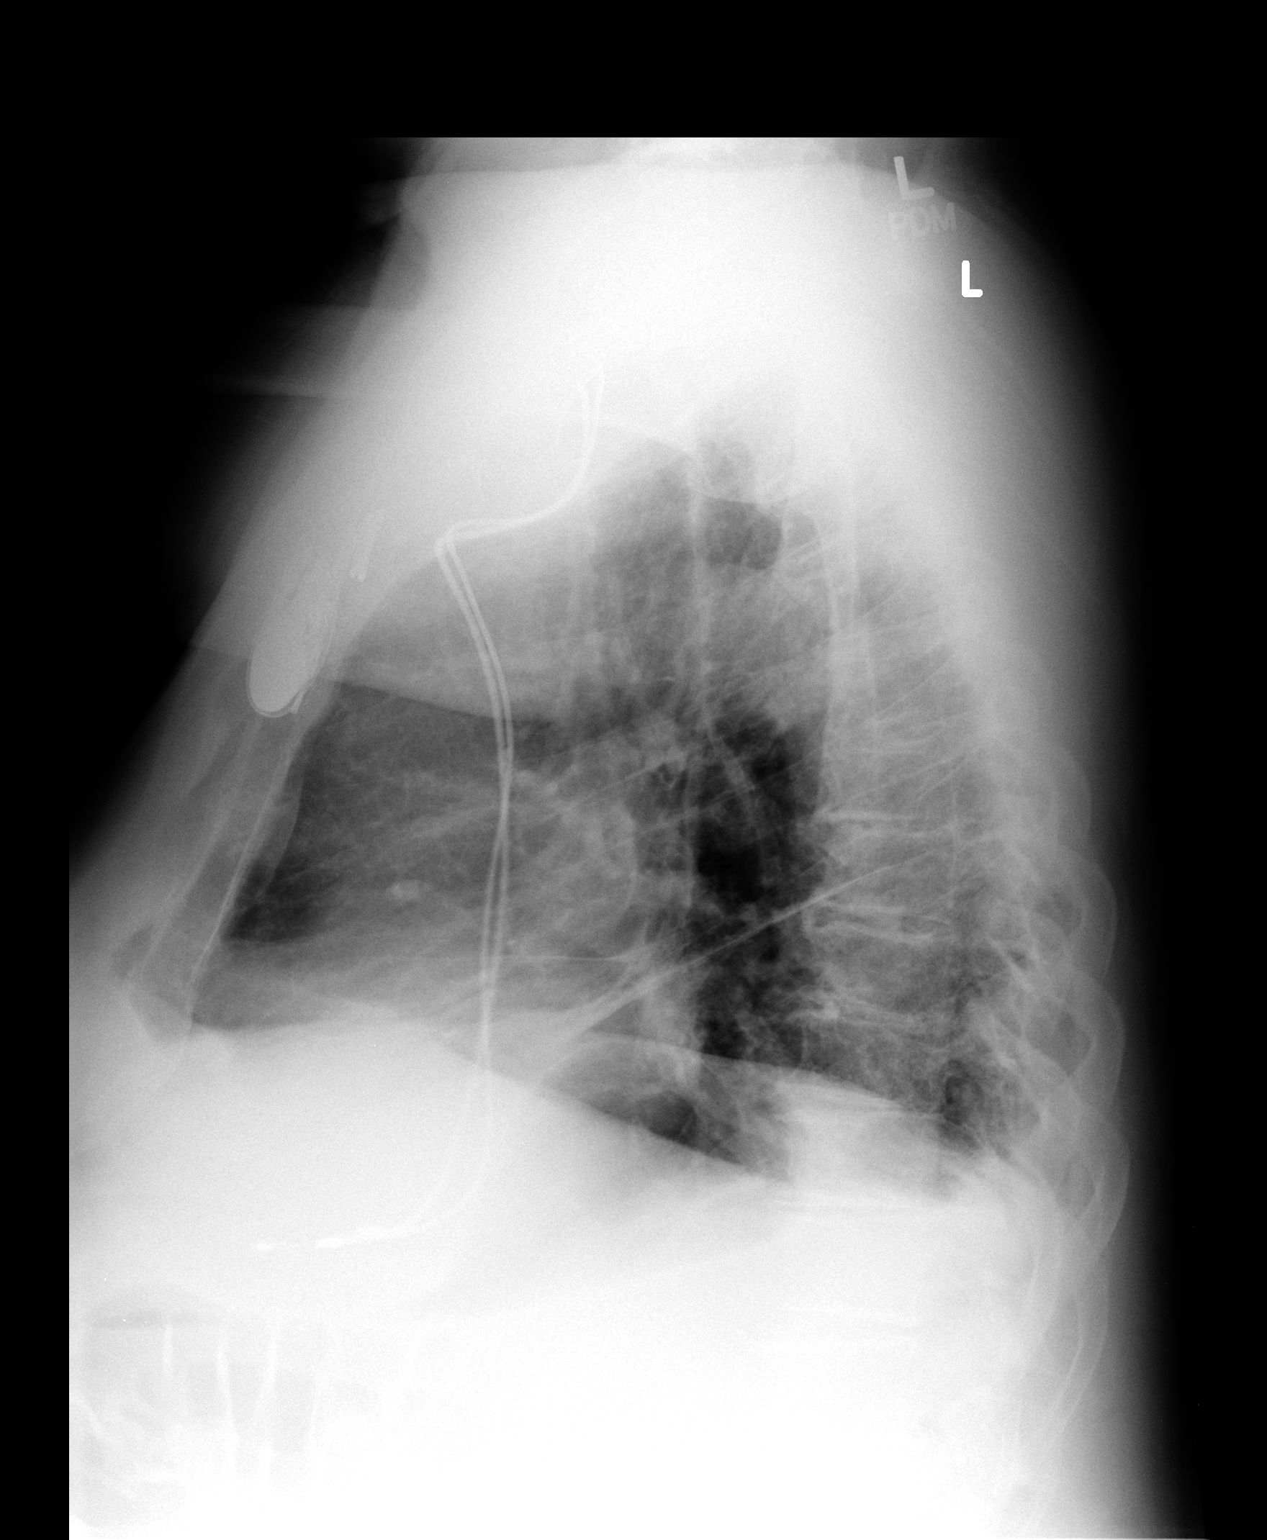

[2 of 2 positions shown; findings below may reference images not displayed]

FINDINGS: Cardiac pacer with lead tips in the right ventricle. Mild
cardiomegaly. Normal pulmonary vascularity. Low lung volumes with
mild bibasilar atelectasis and/or infiltrates. No acute bony
abnormality .
IMPRESSION: 1. Cardiac pacer in stable position. Cardiomegaly. No pulmonary
venous congestion.
2. Bibasilar subsegmental atelectasis.
